# Patient Record
Sex: Female | Born: 1980
Health system: Southern US, Community
[De-identification: ages and names within clinical notes are randomized; demographics above are authoritative.]

---

## 2020-05-10 ENCOUNTER — Encounter (HOSPITAL_COMMUNITY): Payer: Self-pay | Admitting: Emergency Medicine

## 2020-05-10 ENCOUNTER — Other Ambulatory Visit: Payer: Self-pay

## 2020-05-10 ENCOUNTER — Ambulatory Visit (HOSPITAL_COMMUNITY)
Admission: EM | Admit: 2020-05-10 | Discharge: 2020-05-10 | Disposition: A | Discharge status: Home / Self Care | Payer: Self-pay

## 2020-05-10 ENCOUNTER — Ambulatory Visit (HOSPITAL_COMMUNITY): Admission: EM | Admit: 2020-05-10 | Discharge status: Absent without leave | Payer: Self-pay

## 2020-05-10 DIAGNOSIS — L0291 Cutaneous abscess, unspecified: Secondary | ICD-10-CM

## 2020-05-10 MED ORDER — NAPROXEN 500 MG PO TABS: 500.0000 mg | ORAL_TABLET | Freq: Two times a day (BID) | ORAL | 0 refills | Status: DC

## 2020-05-10 MED ORDER — LIDOCAINE-EPINEPHRINE-TETRACAINE (LET) TOPICAL GEL
TOPICAL | Status: AC
  Filled 2020-05-10: qty 3

## 2020-05-10 MED ORDER — DOXYCYCLINE HYCLATE 100 MG PO CAPS: 100.0000 mg | ORAL_CAPSULE | Freq: Two times a day (BID) | ORAL | 0 refills | Status: DC

## 2020-05-10 NOTE — ED Triage Notes (Signed)
Pt presents with abscess in right ear xs 3-4 days.

## 2020-05-10 NOTE — ED Provider Notes (Signed)
  [facility]   MRN: [id number] DOB: 07-21-1980  Subjective:   Ariel Martin is a 40 y.o. female presenting for 3 to 4-day history of acute onset worsening painful swollen lesion in her right ear canal.  She believes it is an abscess.  Denies any fever, nausea, vomiting, tinnitus, runny or stuffy nose, sore throat, cough.  Takes Paxil.  No Known Allergies  History reviewed. No pertinent past medical history.   History reviewed. No pertinent surgical history.  History reviewed. No pertinent family history.     ROS   Objective:   Vitals: BP (!) 158/94 (BP Location: Left Arm)   Pulse 94   Temp 98.7 F (37.1 C) (Oral)   Resp 17   LMP 05/01/2020   SpO2 96%   Physical Exam Constitutional:      General: She is not in acute distress.    Appearance: Normal appearance. She is well-developed. She is not ill-appearing.  HENT:     Head: Normocephalic and atraumatic.     Ears:     Comments: Fluctuant mass of about 1 cm at superior distalmost portion of the external ear canal.  Area is erythematous, exquisitely tender.    Nose: Nose normal.     Mouth/Throat:     Mouth: Mucous membranes are moist.     Pharynx: Oropharynx is clear.  Eyes:     General: No scleral icterus.    Extraocular Movements: Extraocular movements intact.     Pupils: Pupils are equal, round, and reactive to light.  Cardiovascular:     Rate and Rhythm: Normal rate.  Pulmonary:     Effort: Pulmonary effort is normal.  Skin:    General: Skin is warm and dry.  Neurological:     General: No focal deficit present.     Mental Status: She is alert and oriented to person, place, and time.  Psychiatric:        Mood and Affect: Mood normal.        Behavior: Behavior normal.    Procedure: Verbal consent obtained.  Local numbing with l.e.t.  Alcohol swab used over the area.  An 18-gauge needle was used to lance the area.  Discharge of about 2 cc of purulence.  Wound cleansed.  Pressure  dressing applied.   Assessment and Plan :   PDMP not reviewed this encounter.  1. Abscess     Successful incision and drainage.  Recommended started doxycycline, naproxen for pain control.  Counseled on wound care.  Follow-up with ENT if symptoms worsen. Counseled patient on potential for adverse effects with medications prescribed/recommended today, ER and return-to-clinic precautions discussed, patient verbalized understanding.     Wallis Bamberg, PA-C 05/11/20 [phone]

## 2021-01-30 ENCOUNTER — Ambulatory Visit
Admission: RE | Admit: 2021-01-30 | Discharge: 2021-01-30 | Disposition: A | Discharge status: Home / Self Care | Payer: Self-pay | Source: Ambulatory Visit | Attending: Family Medicine | Admitting: Family Medicine

## 2021-01-30 ENCOUNTER — Other Ambulatory Visit: Payer: Self-pay | Admitting: Family Medicine

## 2021-01-30 DIAGNOSIS — M7732 Calcaneal spur, left foot: Secondary | ICD-10-CM | POA: Diagnosis not present

## 2021-01-30 DIAGNOSIS — G4719 Other hypersomnia: Secondary | ICD-10-CM | POA: Diagnosis not present

## 2021-01-30 DIAGNOSIS — K219 Gastro-esophageal reflux disease without esophagitis: Secondary | ICD-10-CM | POA: Diagnosis not present

## 2021-01-30 DIAGNOSIS — M79672 Pain in left foot: Secondary | ICD-10-CM

## 2021-01-30 DIAGNOSIS — R0683 Snoring: Secondary | ICD-10-CM | POA: Diagnosis not present

## 2021-01-30 DIAGNOSIS — M19072 Primary osteoarthritis, left ankle and foot: Secondary | ICD-10-CM | POA: Diagnosis not present

## 2021-01-30 DIAGNOSIS — R030 Elevated blood-pressure reading, without diagnosis of hypertension: Secondary | ICD-10-CM | POA: Diagnosis not present

## 2021-01-30 DIAGNOSIS — Z720 Tobacco use: Secondary | ICD-10-CM | POA: Diagnosis not present

## 2021-01-30 DIAGNOSIS — F418 Other specified anxiety disorders: Secondary | ICD-10-CM | POA: Diagnosis not present

## 2021-02-05 ENCOUNTER — Other Ambulatory Visit (HOSPITAL_BASED_OUTPATIENT_CLINIC_OR_DEPARTMENT_OTHER): Payer: Self-pay

## 2021-02-05 DIAGNOSIS — R0681 Apnea, not elsewhere classified: Secondary | ICD-10-CM

## 2021-02-05 DIAGNOSIS — R0683 Snoring: Secondary | ICD-10-CM

## 2021-02-05 DIAGNOSIS — G4710 Hypersomnia, unspecified: Secondary | ICD-10-CM

## 2021-02-22 ENCOUNTER — Other Ambulatory Visit (HOSPITAL_COMMUNITY): Payer: Self-pay

## 2021-02-22 MED ORDER — PANTOPRAZOLE SODIUM 20 MG PO TBEC
20.0000 mg | DELAYED_RELEASE_TABLET | Freq: Every day | ORAL | 0 refills | Status: DC
Start: 2021-01-30 — End: 2023-10-28
  Filled 2021-02-22: qty 90, 90d supply, fill #0

## 2021-02-22 MED ORDER — ESCITALOPRAM OXALATE 10 MG PO TABS
10.0000 mg | ORAL_TABLET | Freq: Every day | ORAL | 0 refills | Status: DC
  Filled 2021-02-22: qty 60, 60d supply, fill #0

## 2021-02-22 MED ORDER — ESCITALOPRAM OXALATE 10 MG PO TABS: 10.0000 mg | ORAL_TABLET | Freq: Every day | ORAL | 0 refills | Status: DC

## 2021-02-23 ENCOUNTER — Other Ambulatory Visit (HOSPITAL_COMMUNITY): Payer: Self-pay

## 2021-03-02 DIAGNOSIS — R030 Elevated blood-pressure reading, without diagnosis of hypertension: Secondary | ICD-10-CM | POA: Diagnosis not present

## 2021-03-02 DIAGNOSIS — Z0000 Encounter for general adult medical examination without abnormal findings: Secondary | ICD-10-CM | POA: Diagnosis not present

## 2021-03-02 DIAGNOSIS — Z13220 Encounter for screening for lipoid disorders: Secondary | ICD-10-CM | POA: Diagnosis not present

## 2021-03-02 DIAGNOSIS — Z23 Encounter for immunization: Secondary | ICD-10-CM | POA: Diagnosis not present

## 2021-03-02 DIAGNOSIS — F418 Other specified anxiety disorders: Secondary | ICD-10-CM | POA: Diagnosis not present

## 2021-03-02 DIAGNOSIS — G4719 Other hypersomnia: Secondary | ICD-10-CM | POA: Diagnosis not present

## 2021-04-01 ENCOUNTER — Encounter: Payer: 59 | Admitting: Internal Medicine

## 2021-04-25 ENCOUNTER — Other Ambulatory Visit (HOSPITAL_COMMUNITY): Payer: Self-pay

## 2021-04-25 MED ORDER — ESCITALOPRAM OXALATE 10 MG PO TABS
20.0000 mg | ORAL_TABLET | Freq: Every day | ORAL | 1 refills | Status: DC
  Filled 2021-04-25: qty 180, 90d supply, fill #0

## 2021-04-30 ENCOUNTER — Other Ambulatory Visit (HOSPITAL_COMMUNITY): Payer: Self-pay

## 2021-06-11 ENCOUNTER — Other Ambulatory Visit (HOSPITAL_COMMUNITY): Payer: Self-pay

## 2021-06-11 MED ORDER — PANTOPRAZOLE SODIUM 20 MG PO TBEC
DELAYED_RELEASE_TABLET | ORAL | 1 refills | Status: DC
Start: 2021-06-11 — End: 2022-09-02
  Filled 2021-06-11: qty 90, 90d supply, fill #0
  Filled 2021-09-25: qty 90, 90d supply, fill #1
  Filled 2022-03-12: qty 20, 20d supply, fill #2

## 2021-06-11 MED ORDER — DICLOFENAC SODIUM 1 % EX GEL
CUTANEOUS | 1 refills | Status: DC
Start: 2021-06-11 — End: 2022-08-07
  Filled 2021-06-11: qty 200, 30d supply, fill #0
  Filled 2022-03-12: qty 100, 15d supply, fill #1
  Filled 2022-03-12: qty 100, 25d supply, fill #1

## 2021-08-06 ENCOUNTER — Other Ambulatory Visit (HOSPITAL_COMMUNITY): Payer: Self-pay

## 2021-08-06 MED ORDER — ESCITALOPRAM OXALATE 10 MG PO TABS
20.0000 mg | ORAL_TABLET | Freq: Every day | ORAL | 1 refills | Status: DC
  Filled 2021-08-06: qty 180, 90d supply, fill #0
  Filled 2021-10-30: qty 180, 90d supply, fill #1

## 2021-08-06 MED ORDER — PANTOPRAZOLE SODIUM 20 MG PO TBEC
20.0000 mg | DELAYED_RELEASE_TABLET | Freq: Every day | ORAL | 1 refills | Status: DC
  Filled 2021-08-06 – 2021-10-30 (×2): qty 100, 100d supply, fill #0
  Filled 2021-12-20: qty 90, 90d supply, fill #0
  Filled 2022-04-11: qty 90, 90d supply, fill #1
  Filled 2022-07-04: qty 90, 90d supply, fill #2

## 2021-08-09 ENCOUNTER — Other Ambulatory Visit (HOSPITAL_COMMUNITY): Payer: Self-pay

## 2021-09-05 ENCOUNTER — Ambulatory Visit: Payer: 59 | Attending: Internal Medicine | Admitting: Internal Medicine

## 2021-09-05 DIAGNOSIS — G4710 Hypersomnia, unspecified: Secondary | ICD-10-CM | POA: Insufficient documentation

## 2021-09-05 DIAGNOSIS — G4733 Obstructive sleep apnea (adult) (pediatric): Secondary | ICD-10-CM | POA: Insufficient documentation

## 2021-09-05 DIAGNOSIS — R0683 Snoring: Secondary | ICD-10-CM | POA: Insufficient documentation

## 2021-09-05 DIAGNOSIS — G4736 Sleep related hypoventilation in conditions classified elsewhere: Secondary | ICD-10-CM | POA: Diagnosis not present

## 2021-09-05 DIAGNOSIS — R0681 Apnea, not elsewhere classified: Secondary | ICD-10-CM

## 2021-09-15 DIAGNOSIS — G4733 Obstructive sleep apnea (adult) (pediatric): Secondary | ICD-10-CM | POA: Diagnosis not present

## 2021-09-19 NOTE — Procedures (Signed)
    NAME: Ariel Martin DATE OF BIRTH:  1980-11-13 MEDICAL RECORD NUMBER [id number]  LOCATION: [facility]  PHYSICIAN: Deretha Emory  DATE OF STUDY: 09/05/2021  SLEEP STUDY TYPE: Out of Center Sleep Test                REFERRING PHYSICIAN: Irven Coe, MD  EPWORTH SLEEPINESS SCORE:  N/A HEIGHT:    WEIGHT:      There is no height or weight on file to calculate BMI.  NECK SIZE:   in.  CLINICAL INFORMATION The patient was referred to the sleep center for evaluation of excessive daytime sleepiness, awakening gasping for breath, snoring.  MEDICATIONS Patient self administered medications: none reported. No sleep medicine reported by patient  SLEEP STUDY TECHNIQUE A multi-channel overnight portable sleep study was performed. The channels recorded were: nasal and oral airflow, thoracic and abdominal respiratory movement, and oxygen saturation with a pulse oximetry. Snoring and body position were also monitored.  TECHNICIAN COMMENTS Comments added by Technician: N/A Comments added by Scorer: N/A  RECORDING SUMMARY The study was initiated at 10:00:02 PM and terminated at 5:59:56 AM. The total recorded time was 479.9 minutes. Time in bed was 479.9 minutes.  RESPIRATORY PARAMETERS The overall AHI was 79.5 per hour, with a central apnea index of 0 per hour. The patient was supine for 30% of the study. The oxygen nadir was 77% during sleep.  CARDIAC DATA Mean heart rate during sleep was 72.7 bpm.  IMPRESSIONS - Severe Obstructive Sleep Apnea (OSA) - Severe Oxygen Desaturation  DIAGNOSIS - Obstructive Sleep Apnea (G47.33) - Nocturnal Hypoxemia (G47.36)  RECOMMENDATIONS - Therapeutic CPAP titration to determine optimal pressure required to alleviate sleep disordered breathing or a trial of Auto-CPAP 4-16 cm H2O is recommended  Deretha Emory Sleep specialist, American Board of Internal Medicine  ELECTRONICALLY SIGNED ON:  09/19/2021, 9:04 PM [facility] PH: [phone]   FX: [phone] ACCREDITED BY THE AMERICAN ACADEMY OF SLEEP MEDICINE

## 2021-09-20 DIAGNOSIS — G4733 Obstructive sleep apnea (adult) (pediatric): Secondary | ICD-10-CM | POA: Diagnosis not present

## 2021-09-25 ENCOUNTER — Other Ambulatory Visit (HOSPITAL_COMMUNITY): Payer: Self-pay

## 2021-10-01 DIAGNOSIS — G4733 Obstructive sleep apnea (adult) (pediatric): Secondary | ICD-10-CM | POA: Diagnosis not present

## 2021-10-31 ENCOUNTER — Other Ambulatory Visit (HOSPITAL_COMMUNITY): Payer: Self-pay

## 2021-10-31 DIAGNOSIS — G4733 Obstructive sleep apnea (adult) (pediatric): Secondary | ICD-10-CM | POA: Diagnosis not present

## 2021-12-01 DIAGNOSIS — G4733 Obstructive sleep apnea (adult) (pediatric): Secondary | ICD-10-CM | POA: Diagnosis not present

## 2021-12-06 DIAGNOSIS — G4733 Obstructive sleep apnea (adult) (pediatric): Secondary | ICD-10-CM | POA: Diagnosis not present

## 2021-12-06 DIAGNOSIS — R030 Elevated blood-pressure reading, without diagnosis of hypertension: Secondary | ICD-10-CM | POA: Diagnosis not present

## 2021-12-20 ENCOUNTER — Other Ambulatory Visit (HOSPITAL_COMMUNITY): Payer: Self-pay

## 2022-01-01 DIAGNOSIS — G4733 Obstructive sleep apnea (adult) (pediatric): Secondary | ICD-10-CM | POA: Diagnosis not present

## 2022-01-12 IMAGING — DX DG FOOT 2V*L*
2 series · 2 of 2 positions shown · non-contrast
Comparison: None.

CLINICAL DATA: Left-sided foot pain

EXAM:
LEFT FOOT - 2 VIEW

[dg foot 2 views left (1 of 2)]
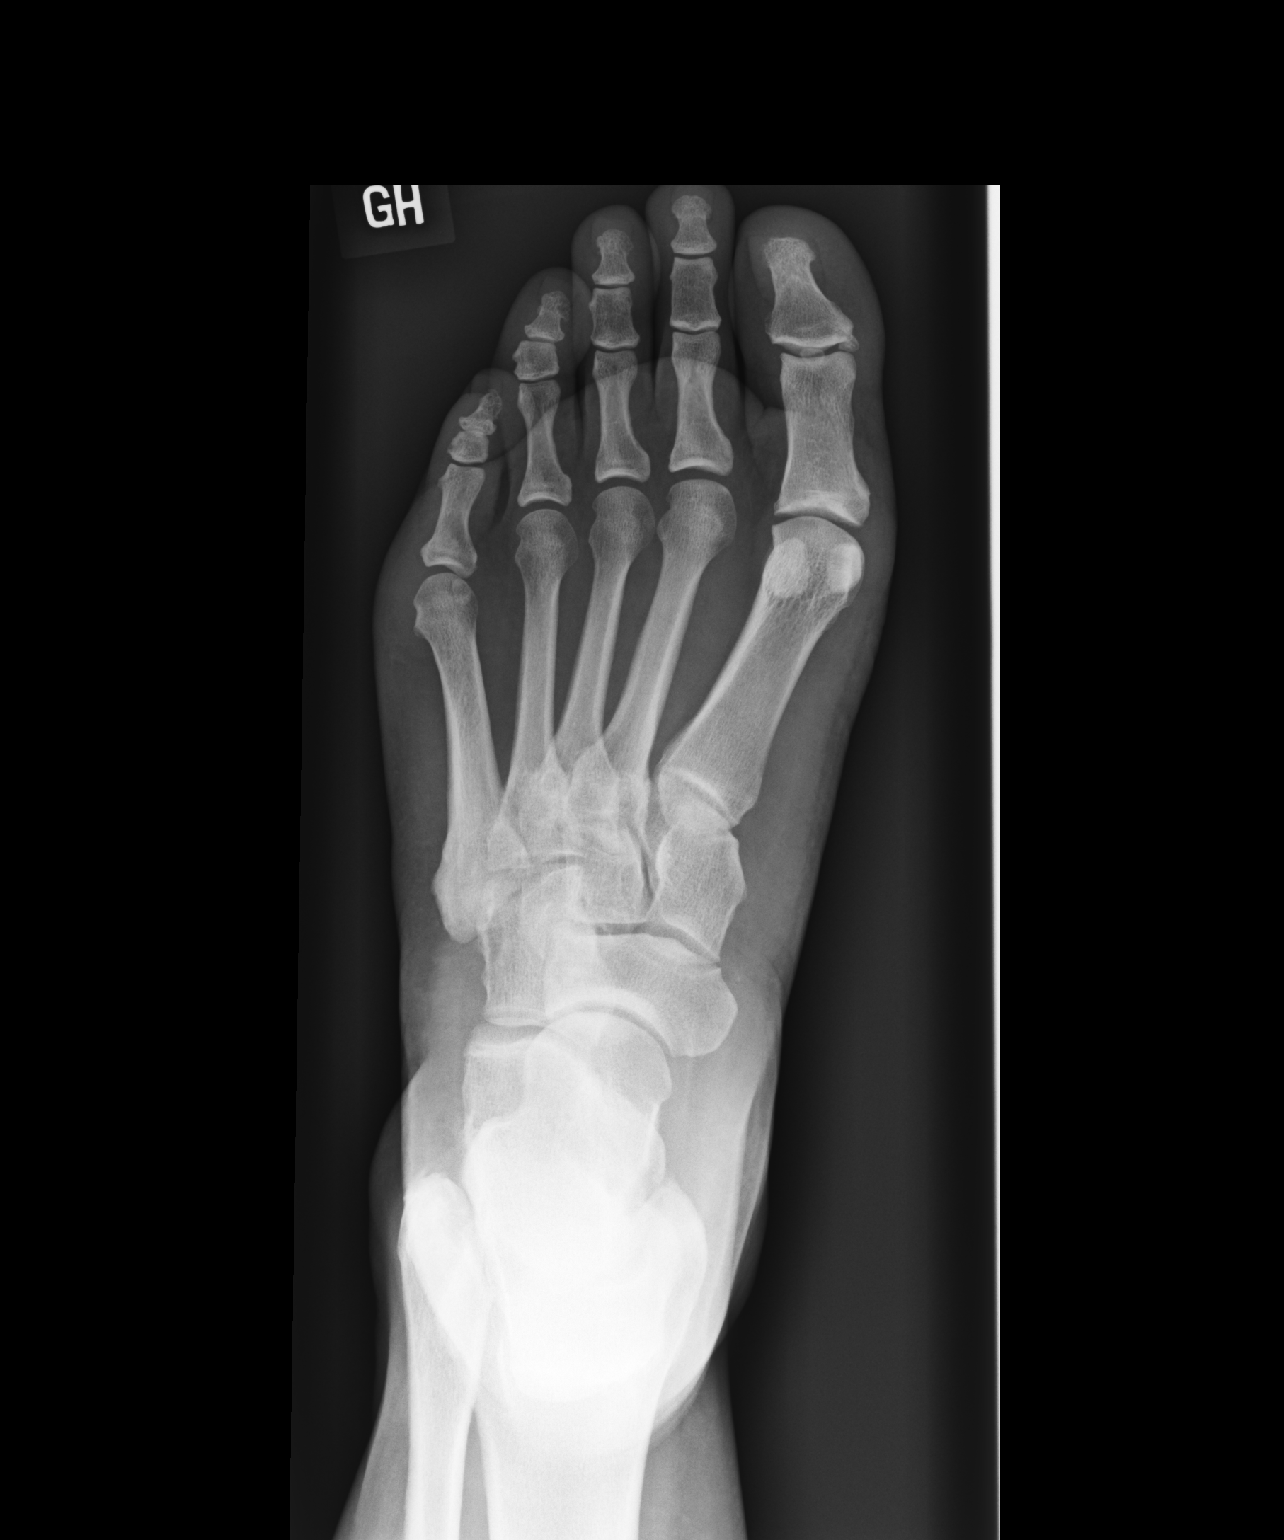

[dg foot 2 views left (2 of 2)]
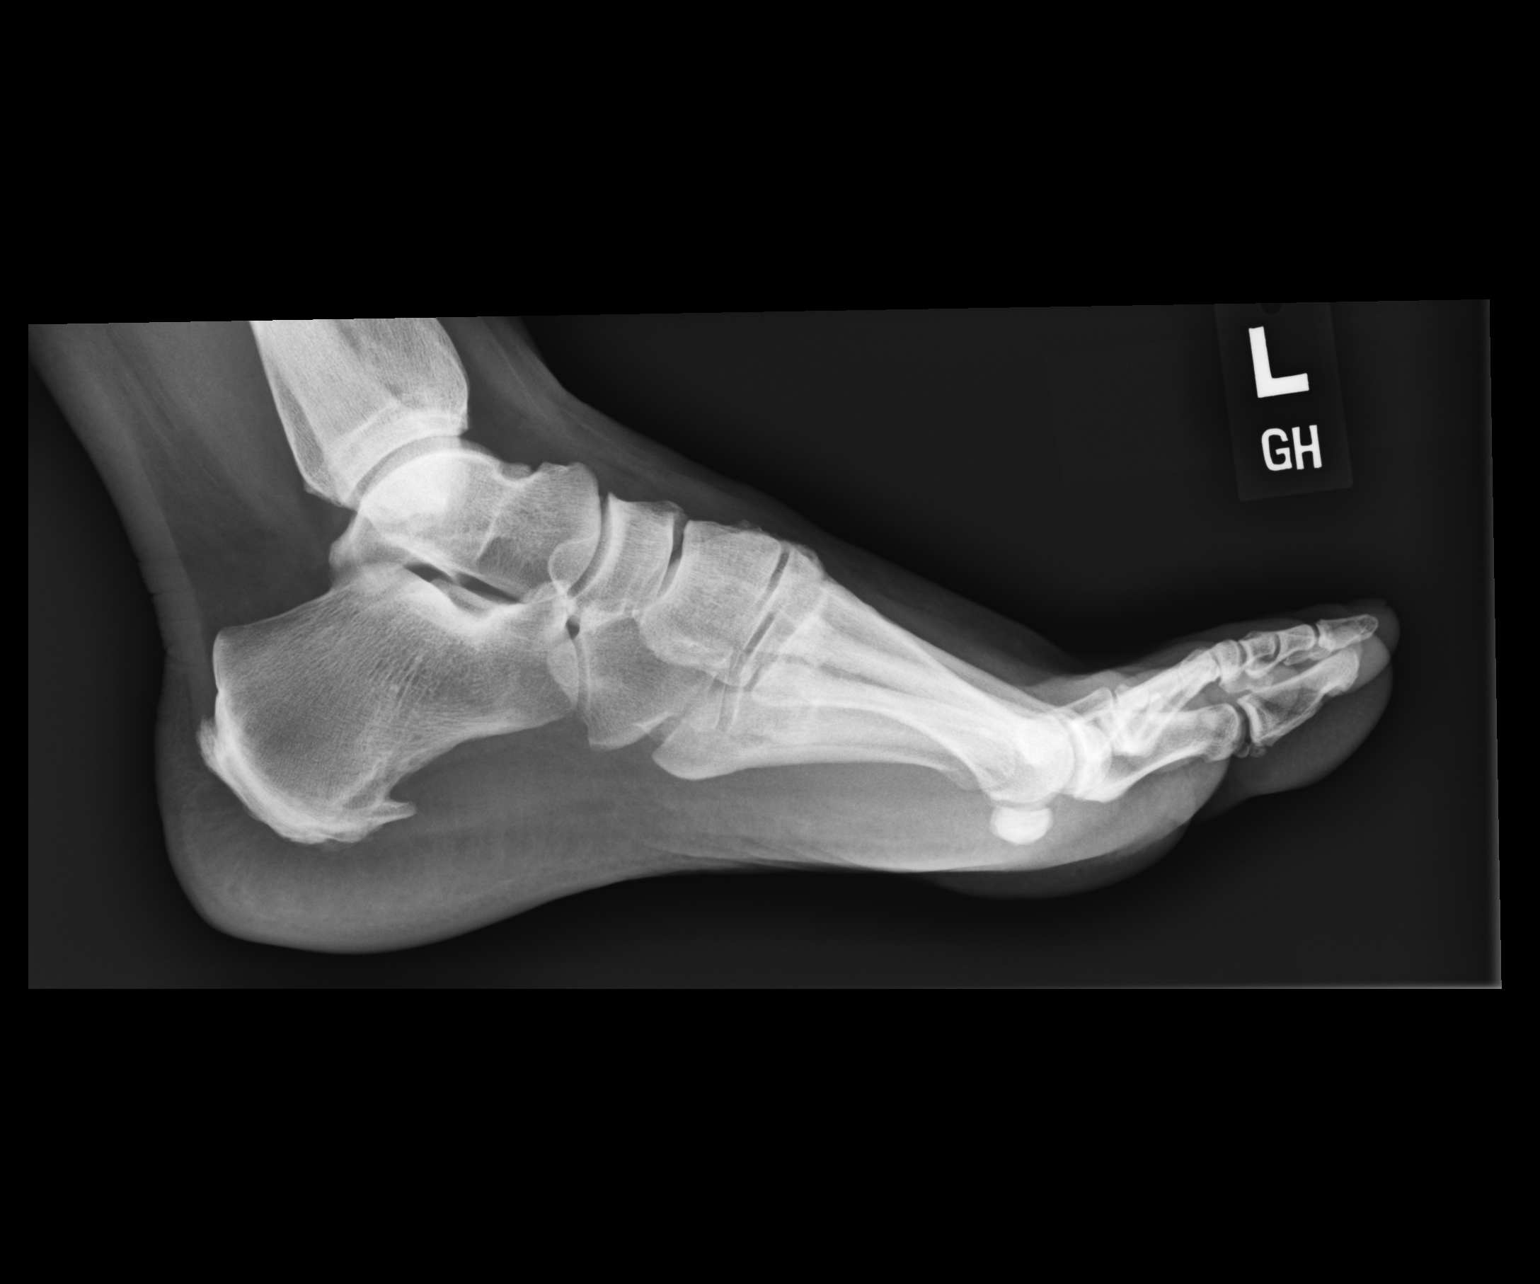

[2 of 2 positions shown; findings below may reference images not displayed]

FINDINGS: No fracture or malalignment. Mild degenerative changes at the first
MTP joint. Moderate plantar calcaneal spur.
IMPRESSION: Mild degenerative change at the first MTP joint.

## 2022-01-29 ENCOUNTER — Other Ambulatory Visit (HOSPITAL_COMMUNITY): Payer: Self-pay

## 2022-01-29 MED ORDER — ESCITALOPRAM OXALATE 10 MG PO TABS
20.0000 mg | ORAL_TABLET | Freq: Every day | ORAL | 1 refills | Status: DC
Start: 2022-01-29 — End: 2022-08-07
  Filled 2022-01-29: qty 180, 90d supply, fill #0
  Filled 2022-04-28: qty 180, 90d supply, fill #1

## 2022-03-12 ENCOUNTER — Other Ambulatory Visit (HOSPITAL_COMMUNITY): Payer: Self-pay

## 2022-03-13 ENCOUNTER — Other Ambulatory Visit (HOSPITAL_COMMUNITY): Payer: Self-pay

## 2022-04-11 ENCOUNTER — Other Ambulatory Visit (HOSPITAL_COMMUNITY): Payer: Self-pay

## 2022-04-28 ENCOUNTER — Other Ambulatory Visit (HOSPITAL_COMMUNITY): Payer: Self-pay

## 2022-05-29 DIAGNOSIS — G4733 Obstructive sleep apnea (adult) (pediatric): Secondary | ICD-10-CM | POA: Diagnosis not present

## 2022-05-29 DIAGNOSIS — R7303 Prediabetes: Secondary | ICD-10-CM | POA: Diagnosis not present

## 2022-05-29 DIAGNOSIS — R030 Elevated blood-pressure reading, without diagnosis of hypertension: Secondary | ICD-10-CM | POA: Diagnosis not present

## 2022-05-29 DIAGNOSIS — K219 Gastro-esophageal reflux disease without esophagitis: Secondary | ICD-10-CM | POA: Diagnosis not present

## 2022-05-29 DIAGNOSIS — F418 Other specified anxiety disorders: Secondary | ICD-10-CM | POA: Diagnosis not present

## 2022-05-29 DIAGNOSIS — Z1159 Encounter for screening for other viral diseases: Secondary | ICD-10-CM | POA: Diagnosis not present

## 2022-05-29 DIAGNOSIS — M25552 Pain in left hip: Secondary | ICD-10-CM | POA: Diagnosis not present

## 2022-05-29 DIAGNOSIS — Z0189 Encounter for other specified special examinations: Secondary | ICD-10-CM | POA: Diagnosis not present

## 2022-06-12 ENCOUNTER — Other Ambulatory Visit: Payer: Self-pay | Admitting: Nurse Practitioner

## 2022-06-12 ENCOUNTER — Other Ambulatory Visit (HOSPITAL_COMMUNITY)
Admission: RE | Admit: 2022-06-12 | Discharge: 2022-06-12 | Disposition: A | Discharge status: Home / Self Care | Payer: Commercial Managed Care - PPO | Source: Ambulatory Visit | Attending: Nurse Practitioner | Admitting: Nurse Practitioner

## 2022-06-12 DIAGNOSIS — R030 Elevated blood-pressure reading, without diagnosis of hypertension: Secondary | ICD-10-CM | POA: Diagnosis not present

## 2022-06-12 DIAGNOSIS — N921 Excessive and frequent menstruation with irregular cycle: Secondary | ICD-10-CM | POA: Diagnosis not present

## 2022-06-12 DIAGNOSIS — Z124 Encounter for screening for malignant neoplasm of cervix: Secondary | ICD-10-CM | POA: Insufficient documentation

## 2022-06-12 DIAGNOSIS — N898 Other specified noninflammatory disorders of vagina: Secondary | ICD-10-CM | POA: Diagnosis not present

## 2022-06-12 DIAGNOSIS — Z1231 Encounter for screening mammogram for malignant neoplasm of breast: Secondary | ICD-10-CM

## 2022-06-12 DIAGNOSIS — N946 Dysmenorrhea, unspecified: Secondary | ICD-10-CM | POA: Diagnosis not present

## 2022-06-12 DIAGNOSIS — N923 Ovulation bleeding: Secondary | ICD-10-CM | POA: Diagnosis not present

## 2022-06-12 DIAGNOSIS — Z113 Encounter for screening for infections with a predominantly sexual mode of transmission: Secondary | ICD-10-CM | POA: Diagnosis not present

## 2022-06-12 DIAGNOSIS — R7401 Elevation of levels of liver transaminase levels: Secondary | ICD-10-CM | POA: Diagnosis not present

## 2022-06-12 DIAGNOSIS — Z01419 Encounter for gynecological examination (general) (routine) without abnormal findings: Secondary | ICD-10-CM | POA: Diagnosis not present

## 2022-06-14 LAB — CYTOLOGY - PAP
Comment: NEGATIVE
Diagnosis: NEGATIVE
High risk HPV: NEGATIVE

## 2022-06-19 ENCOUNTER — Other Ambulatory Visit (HOSPITAL_COMMUNITY): Payer: Self-pay

## 2022-06-19 MED ORDER — TRAZODONE HCL 100 MG PO TABS
100.0000 mg | ORAL_TABLET | Freq: Every evening | ORAL | 1 refills | Status: DC | PRN
Start: 2022-06-19 — End: 2022-12-31
  Filled 2022-06-19: qty 90, 90d supply, fill #0
  Filled 2022-09-30: qty 90, 90d supply, fill #1

## 2022-06-20 ENCOUNTER — Encounter (HOSPITAL_COMMUNITY): Payer: Self-pay

## 2022-06-20 ENCOUNTER — Other Ambulatory Visit: Payer: Self-pay

## 2022-06-20 ENCOUNTER — Other Ambulatory Visit (HOSPITAL_COMMUNITY): Payer: Self-pay

## 2022-06-24 ENCOUNTER — Other Ambulatory Visit (HOSPITAL_COMMUNITY): Payer: Self-pay

## 2022-07-02 ENCOUNTER — Ambulatory Visit
Admission: RE | Admit: 2022-07-02 | Discharge: 2022-07-02 | Disposition: A | Discharge status: Home / Self Care | Payer: Commercial Managed Care - PPO | Source: Ambulatory Visit | Attending: Family Medicine | Admitting: Family Medicine

## 2022-07-02 ENCOUNTER — Other Ambulatory Visit: Payer: Self-pay | Admitting: Family Medicine

## 2022-07-02 DIAGNOSIS — M25552 Pain in left hip: Secondary | ICD-10-CM

## 2022-07-02 DIAGNOSIS — M19072 Primary osteoarthritis, left ankle and foot: Secondary | ICD-10-CM | POA: Diagnosis not present

## 2022-07-03 DIAGNOSIS — Z3202 Encounter for pregnancy test, result negative: Secondary | ICD-10-CM | POA: Diagnosis not present

## 2022-07-03 DIAGNOSIS — N921 Excessive and frequent menstruation with irregular cycle: Secondary | ICD-10-CM | POA: Diagnosis not present

## 2022-07-04 ENCOUNTER — Other Ambulatory Visit (HOSPITAL_COMMUNITY): Payer: Self-pay

## 2022-07-04 ENCOUNTER — Other Ambulatory Visit: Payer: Self-pay

## 2022-07-04 MED ORDER — MELOXICAM 7.5 MG PO TABS
7.5000 mg | ORAL_TABLET | Freq: Every day | ORAL | 6 refills | Status: DC
  Filled 2022-07-04: qty 60, 30d supply, fill #0
  Filled 2022-08-07: qty 60, 30d supply, fill #1
  Filled 2022-09-01: qty 60, 30d supply, fill #2
  Filled 2022-09-30: qty 60, 30d supply, fill #3
  Filled 2022-11-12: qty 60, 30d supply, fill #4
  Filled 2022-12-29 – 2022-12-31 (×2): qty 60, 30d supply, fill #5
  Filled 2023-02-05: qty 60, 30d supply, fill #6

## 2022-07-05 ENCOUNTER — Other Ambulatory Visit: Payer: Self-pay

## 2022-07-05 ENCOUNTER — Other Ambulatory Visit (HOSPITAL_COMMUNITY): Payer: Self-pay

## 2022-07-05 MED ORDER — PANTOPRAZOLE SODIUM 20 MG PO TBEC
20.0000 mg | DELAYED_RELEASE_TABLET | Freq: Every day | ORAL | 0 refills | Status: DC
Start: 2022-07-04 — End: 2023-10-28
  Filled 2022-07-05: qty 90, 90d supply, fill #0

## 2022-07-22 DIAGNOSIS — N921 Excessive and frequent menstruation with irregular cycle: Secondary | ICD-10-CM | POA: Diagnosis not present

## 2022-07-25 ENCOUNTER — Ambulatory Visit
Admission: RE | Admit: 2022-07-25 | Discharge: 2022-07-25 | Disposition: A | Discharge status: Home / Self Care | Payer: Commercial Managed Care - PPO | Source: Ambulatory Visit | Attending: Nurse Practitioner | Admitting: Nurse Practitioner

## 2022-07-25 DIAGNOSIS — Z1231 Encounter for screening mammogram for malignant neoplasm of breast: Secondary | ICD-10-CM | POA: Diagnosis not present

## 2022-07-31 DIAGNOSIS — Z30430 Encounter for insertion of intrauterine contraceptive device: Secondary | ICD-10-CM | POA: Diagnosis not present

## 2022-07-31 DIAGNOSIS — Z3202 Encounter for pregnancy test, result negative: Secondary | ICD-10-CM | POA: Diagnosis not present

## 2022-08-07 ENCOUNTER — Other Ambulatory Visit (HOSPITAL_COMMUNITY): Payer: Self-pay

## 2022-08-07 ENCOUNTER — Other Ambulatory Visit: Payer: Self-pay

## 2022-08-07 MED ORDER — ESCITALOPRAM OXALATE 10 MG PO TABS
20.0000 mg | ORAL_TABLET | Freq: Every day | ORAL | 1 refills | Status: DC
  Filled 2022-08-07: qty 180, 90d supply, fill #0
  Filled 2022-11-12: qty 180, 90d supply, fill #1

## 2022-08-07 MED ORDER — DICLOFENAC SODIUM 1 % EX GEL
CUTANEOUS | 1 refills | Status: DC
Start: 2022-08-07 — End: 2023-10-28
  Filled 2022-08-07: qty 100, 7d supply, fill #0
  Filled 2022-09-01: qty 100, 7d supply, fill #1
  Filled 2022-09-30: qty 100, 7d supply, fill #2

## 2022-09-01 ENCOUNTER — Other Ambulatory Visit (HOSPITAL_COMMUNITY): Payer: Self-pay

## 2022-09-02 ENCOUNTER — Other Ambulatory Visit: Payer: Self-pay

## 2022-09-02 ENCOUNTER — Other Ambulatory Visit (HOSPITAL_COMMUNITY): Payer: Self-pay

## 2022-09-02 MED ORDER — PANTOPRAZOLE SODIUM 20 MG PO TBEC
20.0000 mg | DELAYED_RELEASE_TABLET | Freq: Every day | ORAL | 1 refills | Status: DC
Start: 2022-09-02 — End: 2023-04-28
  Filled 2022-10-08: qty 90, 90d supply, fill #0
  Filled 2022-12-29 – 2022-12-31 (×2): qty 90, 90d supply, fill #1
  Filled 2023-04-26: qty 90, 90d supply, fill #2

## 2022-09-09 DIAGNOSIS — Z30431 Encounter for routine checking of intrauterine contraceptive device: Secondary | ICD-10-CM | POA: Diagnosis not present

## 2022-10-01 ENCOUNTER — Other Ambulatory Visit: Payer: Self-pay

## 2022-10-08 ENCOUNTER — Other Ambulatory Visit: Payer: Self-pay

## 2022-10-08 ENCOUNTER — Other Ambulatory Visit (HOSPITAL_COMMUNITY): Payer: Self-pay

## 2022-11-12 ENCOUNTER — Other Ambulatory Visit: Payer: Self-pay

## 2022-12-29 ENCOUNTER — Other Ambulatory Visit (HOSPITAL_COMMUNITY): Payer: Self-pay

## 2022-12-31 ENCOUNTER — Other Ambulatory Visit: Payer: Self-pay

## 2022-12-31 ENCOUNTER — Other Ambulatory Visit (HOSPITAL_COMMUNITY): Payer: Self-pay

## 2022-12-31 MED ORDER — ESCITALOPRAM OXALATE 10 MG PO TABS
20.0000 mg | ORAL_TABLET | Freq: Every day | ORAL | 1 refills | Status: DC
Start: 2022-12-31 — End: 2023-10-28
  Filled 2023-02-05: qty 180, 90d supply, fill #0
  Filled 2023-05-24: qty 180, 90d supply, fill #1

## 2022-12-31 MED ORDER — TRAZODONE HCL 100 MG PO TABS
100.0000 mg | ORAL_TABLET | Freq: Every evening | ORAL | 1 refills | Status: DC | PRN
Start: 2022-12-31 — End: 2023-07-01
  Filled 2022-12-31: qty 90, 90d supply, fill #0
  Filled 2023-04-02: qty 90, 90d supply, fill #1

## 2023-01-01 ENCOUNTER — Other Ambulatory Visit (HOSPITAL_COMMUNITY): Payer: Self-pay

## 2023-01-02 ENCOUNTER — Other Ambulatory Visit (HOSPITAL_COMMUNITY): Payer: Self-pay

## 2023-02-05 ENCOUNTER — Other Ambulatory Visit (HOSPITAL_COMMUNITY): Payer: Self-pay

## 2023-02-05 ENCOUNTER — Other Ambulatory Visit: Payer: Self-pay

## 2023-04-01 DIAGNOSIS — M545 Low back pain, unspecified: Secondary | ICD-10-CM | POA: Diagnosis not present

## 2023-04-02 ENCOUNTER — Other Ambulatory Visit: Payer: Self-pay

## 2023-04-02 ENCOUNTER — Other Ambulatory Visit (HOSPITAL_COMMUNITY): Payer: Self-pay

## 2023-04-02 MED ORDER — MELOXICAM 7.5 MG PO TABS
7.5000 mg | ORAL_TABLET | Freq: Every day | ORAL | 6 refills | Status: DC
  Filled 2023-04-02: qty 60, 30d supply, fill #0
  Filled 2023-05-24: qty 60, 30d supply, fill #1
  Filled 2023-07-22: qty 60, 30d supply, fill #2

## 2023-04-26 ENCOUNTER — Other Ambulatory Visit (HOSPITAL_COMMUNITY): Payer: Self-pay

## 2023-04-28 ENCOUNTER — Other Ambulatory Visit (HOSPITAL_COMMUNITY): Payer: Self-pay

## 2023-04-28 ENCOUNTER — Other Ambulatory Visit: Payer: Self-pay

## 2023-04-28 MED ORDER — PANTOPRAZOLE SODIUM 20 MG PO TBEC
20.0000 mg | DELAYED_RELEASE_TABLET | Freq: Every day | ORAL | 1 refills | Status: DC
  Filled 2023-04-28: qty 90, 90d supply, fill #0
  Filled 2023-07-22: qty 90, 90d supply, fill #1

## 2023-04-29 DIAGNOSIS — R030 Elevated blood-pressure reading, without diagnosis of hypertension: Secondary | ICD-10-CM | POA: Diagnosis not present

## 2023-04-29 DIAGNOSIS — M5431 Sciatica, right side: Secondary | ICD-10-CM | POA: Diagnosis not present

## 2023-05-25 ENCOUNTER — Other Ambulatory Visit (HOSPITAL_COMMUNITY): Payer: Self-pay

## 2023-06-22 ENCOUNTER — Telehealth: Payer: Commercial Managed Care - PPO | Admitting: Family

## 2023-06-22 DIAGNOSIS — L03019 Cellulitis of unspecified finger: Secondary | ICD-10-CM

## 2023-06-22 MED ORDER — SULFAMETHOXAZOLE-TRIMETHOPRIM 800-160 MG PO TABS: 1.0000 | ORAL_TABLET | Freq: Two times a day (BID) | ORAL | 0 refills | Status: DC

## 2023-06-22 NOTE — Progress Notes (Signed)
 E-Visit for Cellulitis  We are sorry that you are not feeling well. Here is how we plan to help!  Based on what you shared with me it looks like you have cellulitis/paronychia .  Cellulitis looks like areas of skin redness, swelling, and warmth; it develops as a result of bacteria entering under the skin. Little red spots and/or bleeding can be seen in skin, and tiny surface sacs containing fluid can occur. Fever can be present. Cellulitis is almost always on one side of a body, and the lower limbs are the most common site of involvement.   I have prescribed:  Bactrim DS 1 tablet by mouth twice a day for 7 days  HOME CARE:  Take your medications as ordered and take all of them, even if the skin irritation appears to be healing.   GET HELP RIGHT AWAY IF:  Symptoms that don't begin to go away within 48 hours. Severe redness persists or worsens If the area turns color, spreads or swells. If it blisters and opens, develops yellow-brown crust or bleeds. You develop a fever or chills. If the pain increases or becomes unbearable.  Are unable to keep fluids and food down.  MAKE SURE YOU   Understand these instructions. Will watch your condition. Will get help right away if you are not doing well or get worse.  Thank you for choosing an e-visit.  Your e-visit answers were reviewed by a board certified advanced clinical practitioner to complete your personal care plan. Depending upon the condition, your plan could have included both over the counter or prescription medications.  Please review your pharmacy choice. Make sure the pharmacy is open so you can pick up prescription now. If there is a problem, you may contact your provider through [employer] and have the prescription routed to another pharmacy.  Your safety is important to [place]. If you have drug allergies check your prescription carefully.   For the next 24 hours you can use MyChart to ask questions about today's visit, request  a non-urgent call back, or ask for a work or school excuse. You will get an email in the next two days asking about your experience. I hope that your e-visit has been valuable and will speed your recovery.  Approximately 5 minutes was spent documenting and reviewing patient's chart.

## 2023-06-28 ENCOUNTER — Other Ambulatory Visit (HOSPITAL_COMMUNITY): Payer: Self-pay

## 2023-07-01 ENCOUNTER — Other Ambulatory Visit (HOSPITAL_COMMUNITY): Payer: Self-pay

## 2023-07-01 ENCOUNTER — Other Ambulatory Visit: Payer: Self-pay

## 2023-07-01 MED ORDER — TRAZODONE HCL 100 MG PO TABS
100.0000 mg | ORAL_TABLET | Freq: Every evening | ORAL | 0 refills | Status: DC | PRN
Start: 2023-07-01 — End: 2023-10-28
  Filled 2023-07-01: qty 30, 30d supply, fill #0

## 2023-07-22 ENCOUNTER — Other Ambulatory Visit: Payer: Self-pay

## 2023-08-03 ENCOUNTER — Other Ambulatory Visit (HOSPITAL_COMMUNITY): Payer: Self-pay

## 2023-08-06 ENCOUNTER — Other Ambulatory Visit (HOSPITAL_COMMUNITY): Payer: Self-pay

## 2023-08-29 ENCOUNTER — Other Ambulatory Visit (HOSPITAL_COMMUNITY): Payer: Self-pay

## 2023-09-08 ENCOUNTER — Other Ambulatory Visit (HOSPITAL_COMMUNITY): Payer: Self-pay

## 2023-10-28 ENCOUNTER — Ambulatory Visit: Admitting: Physician Assistant

## 2023-10-28 ENCOUNTER — Encounter: Payer: Self-pay | Admitting: Physician Assistant

## 2023-10-28 VITALS — BP 142/88 | HR 107 | Temp 98.1°F | Ht 65.25 in | Wt 276.4 lb

## 2023-10-28 DIAGNOSIS — R7303 Prediabetes: Secondary | ICD-10-CM

## 2023-10-28 DIAGNOSIS — R030 Elevated blood-pressure reading, without diagnosis of hypertension: Secondary | ICD-10-CM | POA: Diagnosis not present

## 2023-10-28 DIAGNOSIS — M722 Plantar fascial fibromatosis: Secondary | ICD-10-CM

## 2023-10-28 DIAGNOSIS — Z13220 Encounter for screening for lipoid disorders: Secondary | ICD-10-CM

## 2023-10-28 DIAGNOSIS — K219 Gastro-esophageal reflux disease without esophagitis: Secondary | ICD-10-CM | POA: Diagnosis not present

## 2023-10-28 DIAGNOSIS — M160 Bilateral primary osteoarthritis of hip: Secondary | ICD-10-CM

## 2023-10-28 DIAGNOSIS — I10 Essential (primary) hypertension: Secondary | ICD-10-CM | POA: Insufficient documentation

## 2023-10-28 DIAGNOSIS — G4700 Insomnia, unspecified: Secondary | ICD-10-CM

## 2023-10-28 DIAGNOSIS — G4733 Obstructive sleep apnea (adult) (pediatric): Secondary | ICD-10-CM | POA: Insufficient documentation

## 2023-10-28 DIAGNOSIS — Z7689 Persons encountering health services in other specified circumstances: Secondary | ICD-10-CM

## 2023-10-28 DIAGNOSIS — F339 Major depressive disorder, recurrent, unspecified: Secondary | ICD-10-CM

## 2023-10-28 DIAGNOSIS — F411 Generalized anxiety disorder: Secondary | ICD-10-CM | POA: Diagnosis not present

## 2023-10-28 MED ORDER — PANTOPRAZOLE SODIUM 20 MG PO TBEC
20.0000 mg | DELAYED_RELEASE_TABLET | Freq: Every day | ORAL | 1 refills | Status: DC
  Filled 2023-11-29: qty 90, 90d supply, fill #0
  Filled 2024-02-29: qty 80, 80d supply, fill #1

## 2023-10-28 MED ORDER — ESZOPICLONE 1 MG PO TABS: 1.0000 mg | ORAL_TABLET | Freq: Every evening | ORAL | 1 refills | Status: DC | PRN

## 2023-10-28 MED ORDER — DICLOFENAC SODIUM 1 % EX GEL
CUTANEOUS | 1 refills | Status: AC
  Filled 2023-11-29: qty 100, 30d supply, fill #0

## 2023-10-28 MED ORDER — MELOXICAM 7.5 MG PO TABS
7.5000 mg | ORAL_TABLET | Freq: Every day | ORAL | 6 refills | Status: AC
  Filled 2023-11-29: qty 60, 30d supply, fill #0
  Filled 2024-01-04: qty 60, 30d supply, fill #1
  Filled 2024-01-30: qty 60, 30d supply, fill #2
  Filled 2024-02-29: qty 60, 30d supply, fill #3
  Filled 2024-04-10: qty 60, 30d supply, fill #4
  Filled 2024-05-13: qty 60, 30d supply, fill #5

## 2023-10-28 MED ORDER — ESCITALOPRAM OXALATE 10 MG PO TABS
ORAL_TABLET | ORAL | 0 refills | Status: DC
  Filled 2023-12-02: qty 24, 12d supply, fill #0

## 2023-10-28 NOTE — Assessment & Plan Note (Signed)
 Patient presents today with elevated blood pressure readings. She endorses history of white coat syndrome and blood pressures being influenced by emotions. She denies headaches, visual changes, or chest pain. Blood pressure log given today. Will discuss treatment options at her 6 week follow up. Advised DASH diet and increased physical activity.

## 2023-10-28 NOTE — Assessment & Plan Note (Signed)
 Patient presents today with history of insomnia. We discussed importance of sleep hygiene. I advised regular exercise and decreased alcohol intake. Will trial Lunesta at bedtime. Follow up in 6 weeks.

## 2023-10-28 NOTE — Assessment & Plan Note (Signed)
 Lexapro  refilled for anxiety. Start 10mg  daily for 2 weeks and then may increase to 20mg  daily. Patient counseled on side effects such as nausea and headache, she was advised to stop taking medication if she experiences suicidal ideation. Follow up in 6 weeks.

## 2023-10-28 NOTE — Assessment & Plan Note (Signed)
 Stable on current treatment plan. Medication refilled.

## 2023-10-28 NOTE — Assessment & Plan Note (Signed)
 Reports history of prediabetes, last recalled A1c of 5.8. A1c today. Will discuss treatment options at follow up pending lab results.

## 2023-10-28 NOTE — Assessment & Plan Note (Signed)
 Patient presents today with history of depression, worse in the last few months. Lexapro  refilled today. Patient advised 10mg  daily for 2 weeks, and may increase back to 20mg  daily there after. Patient counseled on side effects such as nausea and headache, she was advised to stop taking medication if she experiences suicidal ideation. Patient to follow up in 6 weeks.

## 2023-10-28 NOTE — Progress Notes (Signed)
 New Patient Office Visit  Subjective    Patient ID: Ariel Martin, female    DOB: January 14, 1981  Age: 43 y.o. MRN: [id number]  CC: No chief complaint on file.   HPI Ariel Martin presents to establish care  Patient presents today with past medical history significant for depression, anxiety, obstructive sleep apnea, prediabetes, insomnia, GERD, plantar fasciitis, and hip osteoarthritis. She reports she has been out of daily medication for about 2 months as she has been waiting to establish care with our office. Today she reports significant depression and anxiety, due current life situations. She denies mania or suicidal ideation. She does admit to drinking more alcohol (wine) than usual, approximately 3-4 bottles a week in the last month or so. Additionally, she reports history of insomnia, not improved with trazodone . She endorses night time routine with a regular bed time, however still has 2-3 sleepless nights per week. She I s interested in medication to improve symptoms.     Outpatient Encounter Medications as of 10/28/2023  Medication Sig   escitalopram  (LEXAPRO ) 10 MG tablet Take 1 tablet (10 mg total) by mouth daily for 14 days, THEN 2 tablets (20 mg total) daily.   eszopiclone (LUNESTA) 1 MG TABS tablet Take 1 tablet (1 mg total) by mouth at bedtime as needed for sleep. Take immediately before bedtime   [DISCONTINUED] diclofenac  Sodium (VOLTAREN  ARTHRITIS PAIN) 1 % GEL Apply topically as directed   [DISCONTINUED] escitalopram  (LEXAPRO ) 10 MG tablet Take 2 tablets (20 mg total) by mouth daily.   [DISCONTINUED] meloxicam  (MOBIC ) 7.5 MG tablet Take 1-2 tablets (7.5-15 mg total) by mouth daily.   [DISCONTINUED] pantoprazole  (PROTONIX ) 20 MG tablet Take 1 tablet (20 mg total) by mouth daily.   [DISCONTINUED] traZODone  (DESYREL ) 100 MG tablet Take 1 tablet (100 mg total) by mouth at bedtime as needed. *pt needs appt for refills*   diclofenac  Sodium (VOLTAREN  ARTHRITIS PAIN) 1 % GEL Apply  topically as directed   meloxicam  (MOBIC ) 7.5 MG tablet Take 1-2 tablets (7.5-15 mg total) by mouth daily.   pantoprazole  (PROTONIX ) 20 MG tablet Take 1 tablet (20 mg total) by mouth daily.   [DISCONTINUED] doxycycline  (VIBRAMYCIN ) 100 MG capsule Take 1 capsule (100 mg total) by mouth 2 (two) times daily. (Patient not taking: Reported on 10/28/2023)   [DISCONTINUED] escitalopram  (LEXAPRO ) 10 MG tablet Take 1 tablet (10 mg total) by mouth daily. (Patient not taking: Reported on 10/28/2023)   [DISCONTINUED] escitalopram  (LEXAPRO ) 10 MG tablet Take 1 tablet (10 mg total) by mouth daily. (Patient not taking: Reported on 10/28/2023)   [DISCONTINUED] escitalopram  (LEXAPRO ) 10 MG tablet Take 2 tablets (20 mg total) by mouth daily.   [DISCONTINUED] naproxen  (NAPROSYN ) 500 MG tablet Take 1 tablet (500 mg total) by mouth 2 (two) times daily with a meal.   [DISCONTINUED] pantoprazole  (PROTONIX ) 20 MG tablet Take 1 tablet (20 mg total) by mouth daily. (Patient not taking: Reported on 10/28/2023)   [DISCONTINUED] pantoprazole  (PROTONIX ) 20 MG tablet Take 1 tablet (20 mg total) by mouth daily. (Patient not taking: Reported on 10/28/2023)   [DISCONTINUED] PARoxetine (PAXIL) 20 MG tablet Take 20 mg by mouth daily.   [DISCONTINUED] sulfamethoxazole -trimethoprim  (BACTRIM  DS) 800-160 MG tablet Take 1 tablet by mouth 2 (two) times daily. (Patient not taking: Reported on 10/28/2023)   No facility-administered encounter medications on file as of 10/28/2023.    History reviewed. No pertinent past medical history.  History reviewed. No pertinent surgical history.  Family History  Problem Relation Age of Onset  Breast cancer Neg Hx     Social History   Socioeconomic History   Marital status: Single    Spouse name: Not on file   Number of children: Not on file   Years of education: Not on file   Highest education level: Not on file  Occupational History   Not on file  Tobacco Use   Smoking status: Never   Smokeless  tobacco: Never  Substance and Sexual Activity   Alcohol use: Yes   Drug use: Not on file   Sexual activity: Not on file  Other Topics Concern   Not on file  Social History Narrative   Not on file   Social Drivers of Health   Financial Resource Strain: Not on file  Food Insecurity: Not on file  Transportation Needs: Not on file  Physical Activity: Not on file  Stress: Not on file  Social Connections: Not on file  Intimate Partner Violence: Not on file    Review of Systems  Constitutional:  Negative for fever, malaise/fatigue and weight loss.  Respiratory:  Negative for cough and shortness of breath.   Cardiovascular:  Negative for chest pain.  Neurological:  Negative for dizziness.  Endo/Heme/Allergies:  Negative for polydipsia.  Psychiatric/Behavioral:  Positive for depression. Negative for suicidal ideas. The patient is nervous/anxious and has insomnia.         Objective    BP (!) 142/88   Pulse (!) 107   Temp 98.1 F (36.7 C)   Ht 5' 5.25 (1.657 m)   Wt 276 lb 6.4 oz (125.4 kg)   SpO2 99%   BMI 45.64 kg/m   Physical Exam Constitutional:      Appearance: Normal appearance. She is obese.  HENT:     Head: Normocephalic.     Mouth/Throat:     Mouth: Mucous membranes are moist.     Pharynx: Oropharynx is clear.   Eyes:     Extraocular Movements: Extraocular movements intact.     Conjunctiva/sclera: Conjunctivae normal.    Cardiovascular:     Rate and Rhythm: Normal rate and regular rhythm.     Heart sounds: Normal heart sounds. No murmur heard. Pulmonary:     Effort: Pulmonary effort is normal.     Breath sounds: No wheezing, rhonchi or rales.   Musculoskeletal:     Right lower leg: No edema.     Left lower leg: No edema.   Skin:    General: Skin is warm and dry.   Neurological:     General: No focal deficit present.     Mental Status: She is alert and oriented to person, place, and time.   Psychiatric:        Mood and Affect: Mood normal.         Behavior: Behavior normal.       Assessment & Plan:  Encounter to establish care  Depression, recurrent [facility]) Assessment & Plan: Patient presents today with history of depression, worse in the last few months. Lexapro  refilled today. Patient advised 10mg  daily for 2 weeks, and may increase back to 20mg  daily there after. Patient counseled on side effects such as nausea and headache, she was advised to stop taking medication if she experiences suicidal ideation. Patient to follow up in 6 weeks.   Orders: -     Escitalopram  Oxalate; Take 1 tablet (10 mg total) by mouth daily for 14 days, THEN 2 tablets (20 mg total) daily.  Dispense: 74 tablet; Refill: 0  Insomnia,  unspecified type Assessment & Plan: Patient presents today with history of insomnia. We discussed importance of sleep hygiene. I advised regular exercise and decreased alcohol intake. Will trial Lunesta at bedtime. Follow up in 6 weeks.    Orders: -     Eszopiclone; Take 1 tablet (1 mg total) by mouth at bedtime as needed for sleep. Take immediately before bedtime  Dispense: 30 tablet; Refill: 1  Generalized anxiety disorder Assessment & Plan: Lexapro  refilled for anxiety. Start 10mg  daily for 2 weeks and then may increase to 20mg  daily. Patient counseled on side effects such as nausea and headache, she was advised to stop taking medication if she experiences suicidal ideation. Follow up in 6 weeks.   Orders: -     Escitalopram  Oxalate; Take 1 tablet (10 mg total) by mouth daily for 14 days, THEN 2 tablets (20 mg total) daily.  Dispense: 74 tablet; Refill: 0  Elevated blood pressure reading without diagnosis of hypertension Assessment & Plan: Patient presents today with elevated blood pressure readings. She endorses history of white coat syndrome and blood pressures being influenced by emotions. She denies headaches, visual changes, or chest pain. Blood pressure log given today. Will discuss treatment options at her 6  week follow up. Advised DASH diet and increased physical activity.    Prediabetes Assessment & Plan: Reports history of prediabetes, last recalled A1c of 5.8. A1c today. Will discuss treatment options at follow up pending lab results.   Orders: -     Lipid panel -     CMP14+EGFR -     CBC with Differential/Platelet -     Hemoglobin A1c  Gastroesophageal reflux disease without esophagitis Assessment & Plan: Stable on current treatment plan. Medication refilled.   Orders: -     Pantoprazole  Sodium; Take 1 tablet (20 mg total) by mouth daily.  Dispense: 100 tablet; Refill: 1  Osteoarthritis of both hips, unspecified osteoarthritis type -     Meloxicam ; Take 1-2 tablets (7.5-15 mg total) by mouth daily.  Dispense: 60 tablet; Refill: 6  Plantar fasciitis -     Diclofenac  Sodium; Apply topically as directed  Dispense: 150 g; Refill: 1  Screening for lipid disorders -     Lipid panel    Return in about 6 weeks (around 12/09/2023) for depression and sleep.   Charmaine Brahim Dolman, PA-C

## 2023-10-29 ENCOUNTER — Ambulatory Visit: Payer: Self-pay | Admitting: Physician Assistant

## 2023-10-29 LAB — CMP14+EGFR
ALT: 140 IU/L — ABNORMAL HIGH (ref 0–32)
AST: 77 IU/L — ABNORMAL HIGH (ref 0–40)
Albumin: 4.5 g/dL (ref 3.9–4.9)
Alkaline Phosphatase: 91 IU/L (ref 44–121)
BUN/Creatinine Ratio: 14 (ref 9–23)
BUN: 11 mg/dL (ref 6–24)
Bilirubin Total: 0.6 mg/dL (ref 0.0–1.2)
CO2: 24 mmol/L (ref 20–29)
Calcium: 9.6 mg/dL (ref 8.7–10.2)
Chloride: 99 mmol/L (ref 96–106)
Creatinine, Ser: 0.78 mg/dL (ref 0.57–1.00)
Globulin, Total: 3 g/dL (ref 1.5–4.5)
Glucose: 128 mg/dL — ABNORMAL HIGH (ref 70–99)
Potassium: 4.4 mmol/L (ref 3.5–5.2)
Sodium: 138 mmol/L (ref 134–144)
Total Protein: 7.5 g/dL (ref 6.0–8.5)
eGFR: 97 mL/min/{1.73_m2} (ref >59)

## 2023-10-29 LAB — CBC WITH DIFFERENTIAL/PLATELET
Basophils Absolute: 0.1 10*3/uL (ref 0.0–0.2)
Basos: 0 %
EOS (ABSOLUTE): 0.2 10*3/uL (ref 0.0–0.4)
Eos: 2 %
Hematocrit: 46.3 % (ref 34.0–46.6)
Hemoglobin: 15.3 g/dL (ref 11.1–15.9)
Immature Grans (Abs): 0 10*3/uL (ref 0.0–0.1)
Immature Granulocytes: 0 %
Lymphocytes Absolute: 3.6 10*3/uL — ABNORMAL HIGH (ref 0.7–3.1)
Lymphs: 26 %
MCH: 29.1 pg (ref 26.6–33.0)
MCHC: 33 g/dL (ref 31.5–35.7)
MCV: 88 fL (ref 79–97)
Monocytes Absolute: 0.7 10*3/uL (ref 0.1–0.9)
Monocytes: 5 %
Neutrophils Absolute: 8.9 10*3/uL — ABNORMAL HIGH (ref 1.4–7.0)
Neutrophils: 67 %
Platelets: 337 10*3/uL (ref 150–450)
RBC: 5.25 x10E6/uL (ref 3.77–5.28)
RDW: 12.9 % (ref 11.7–15.4)
WBC: 13.5 10*3/uL — ABNORMAL HIGH (ref 3.4–10.8)

## 2023-10-29 LAB — LIPID PANEL
Chol/HDL Ratio: 3.7 ratio (ref 0.0–4.4)
Cholesterol, Total: 223 mg/dL — ABNORMAL HIGH (ref 100–199)
HDL: 60 mg/dL (ref >39)
LDL Chol Calc (NIH): 139 mg/dL — ABNORMAL HIGH (ref 0–99)
Triglycerides: 136 mg/dL (ref 0–149)
VLDL Cholesterol Cal: 24 mg/dL (ref 5–40)

## 2023-10-29 LAB — HEMOGLOBIN A1C
Est. average glucose Bld gHb Est-mCnc: 128 mg/dL
Hgb A1c MFr Bld: 6.1 % — ABNORMAL HIGH (ref 4.8–5.6)

## 2023-11-08 ENCOUNTER — Encounter: Payer: Self-pay | Admitting: Physician Assistant

## 2023-11-12 ENCOUNTER — Other Ambulatory Visit: Payer: Self-pay | Admitting: Physician Assistant

## 2023-11-12 DIAGNOSIS — G4700 Insomnia, unspecified: Secondary | ICD-10-CM

## 2023-11-12 MED ORDER — ESZOPICLONE 2 MG PO TABS: 2.0000 mg | ORAL_TABLET | Freq: Every evening | ORAL | 1 refills | Status: DC | PRN

## 2023-11-29 ENCOUNTER — Other Ambulatory Visit (HOSPITAL_COMMUNITY): Payer: Self-pay

## 2023-12-01 ENCOUNTER — Other Ambulatory Visit (HOSPITAL_COMMUNITY): Payer: Self-pay

## 2023-12-01 ENCOUNTER — Other Ambulatory Visit: Payer: Self-pay

## 2023-12-03 ENCOUNTER — Other Ambulatory Visit (HOSPITAL_COMMUNITY): Payer: Self-pay

## 2023-12-09 ENCOUNTER — Encounter: Payer: Self-pay | Admitting: Physician Assistant

## 2023-12-09 ENCOUNTER — Ambulatory Visit: Admitting: Physician Assistant

## 2023-12-09 ENCOUNTER — Other Ambulatory Visit (HOSPITAL_COMMUNITY): Payer: Self-pay

## 2023-12-09 ENCOUNTER — Other Ambulatory Visit: Payer: Self-pay

## 2023-12-09 VITALS — BP 160/89 | HR 76 | Temp 97.2°F | Ht 65.25 in | Wt 276.2 lb

## 2023-12-09 DIAGNOSIS — I10 Essential (primary) hypertension: Secondary | ICD-10-CM

## 2023-12-09 DIAGNOSIS — G4700 Insomnia, unspecified: Secondary | ICD-10-CM

## 2023-12-09 MED ORDER — TRAZODONE HCL 50 MG PO TABS
50.0000 mg | ORAL_TABLET | Freq: Every day | ORAL | 1 refills | Status: DC
  Filled 2023-12-09: qty 90, 90d supply, fill #0

## 2023-12-09 MED ORDER — ESCITALOPRAM OXALATE 20 MG PO TABS
20.0000 mg | ORAL_TABLET | Freq: Every day | ORAL | 1 refills | Status: AC
  Filled 2023-12-09: qty 90, 90d supply, fill #0
  Filled 2024-03-13: qty 90, 90d supply, fill #1

## 2023-12-09 MED ORDER — VALSARTAN 80 MG PO TABS
80.0000 mg | ORAL_TABLET | Freq: Every day | ORAL | 1 refills | Status: DC
  Filled 2023-12-09: qty 30, 30d supply, fill #0
  Filled 2024-01-09: qty 30, 30d supply, fill #1

## 2023-12-09 NOTE — Progress Notes (Signed)
 Established Patient Office Visit  Subjective   Patient ID: Ariel Martin, female    DOB: 14-Jan-1981  Age: 43 y.o. MRN: [id number]  No chief complaint on file.   Patient presents today for follow up regarding depression, anxiety, sleep, and blood pressure. She reports she feels much improved with Lexapro , states anxiety and depression symptoms are back to her baseline. Denies SI or panic attacks. She reports, Lunesta  was effective for sleep for a few nights, but states it felt like my body was fighting through the medicine, and although she was tired she was unable to fall asleep. States she does not feel anxious at night however being unable to fall asleep makes her frustrated and anxious. Previously, was on trazodone  but stopped medication in the past due to feelings of tolerance. She reports improvement in sleep hygiene and night time routine, however continue to have poor sleep, getting approximately 5 hours a night. Additionally, she reports decreased alcohol intake. Lastly, patient reports blood pressure was on average 130-140/70s at home. Denies headache, chest pain, shortness of breath, or visual changes, however wonders if this is what is causing troubles with her sleep.      Review of Systems  Constitutional:  Negative for chills, fever and malaise/fatigue.  Eyes:  Negative for blurred vision and double vision.  Respiratory:  Negative for cough and shortness of breath.   Cardiovascular:  Negative for chest pain and palpitations.  Musculoskeletal:  Negative for joint pain and myalgias.  Neurological:  Negative for dizziness and headaches.  Psychiatric/Behavioral:  The patient has insomnia.       Objective:     BP (!) 160/89   Pulse 76   Temp (!) 97.2 F (36.2 C)   Ht 5' 5.25 (1.657 m)   Wt 276 lb 3.2 oz (125.3 kg)   SpO2 98%   BMI 45.61 kg/m    Physical Exam Constitutional:      Appearance: Normal appearance.  HENT:     Head: Normocephalic and atraumatic.      Mouth/Throat:     Mouth: Mucous membranes are moist.     Pharynx: Oropharynx is clear.  Eyes:     Extraocular Movements: Extraocular movements intact.     Conjunctiva/sclera: Conjunctivae normal.  Cardiovascular:     Rate and Rhythm: Normal rate and regular rhythm.     Heart sounds: Normal heart sounds. No murmur heard. Pulmonary:     Effort: Pulmonary effort is normal.     Breath sounds: Normal breath sounds. No wheezing, rhonchi or rales.  Skin:    General: Skin is warm and dry.  Neurological:     General: No focal deficit present.     Mental Status: She is alert and oriented to person, place, and time.  Psychiatric:        Mood and Affect: Mood normal.        Behavior: Behavior normal.      No results found for any visits on 12/09/23.  The 10-year ASCVD risk score (Arnett DK, et al., 2019) is: 1.6%    Assessment & Plan:   Return in about 4 weeks (around 01/06/2024) for BP .   Insomnia, unspecified type Assessment & Plan: Patient presents today with continued concerns with insomnia. Discussed importance of routine and sleep hygiene. Will trial trazodone  as needed for sleep. Patient encouraged to sleep when she feels tired, and get out of bed if she has not fallen asleep within an hour. Discussed relaxation techniques.   Orders: -  traZODone  HCl; Take 1 tablet (50 mg total) by mouth at bedtime.  Dispense: 90 tablet; Refill: 1  Primary hypertension Assessment & Plan: 167/108, 160/89 Uncontrolled. Considering elevated home readings as well as in office readings, will start valsartan  80 mg daily.  Discussed DASH diet and dietary sodium restrictions.  Increase dietary efforts and physical activity. Patient to follow up in 4 weeks.    Orders: -     Valsartan ; Take 1 tablet (80 mg total) by mouth daily.  Dispense: 30 tablet; Refill: 1  Other orders -     Escitalopram  Oxalate; Take 1 tablet (20 mg total) by mouth daily.  Dispense: 90 tablet; Refill: 1    Fain Francis, PA-C

## 2023-12-09 NOTE — Assessment & Plan Note (Signed)
 Patient presents today with continued concerns with insomnia. Discussed importance of routine and sleep hygiene. Will trial trazodone  as needed for sleep. Patient encouraged to sleep when she feels tired, and get out of bed if she has not fallen asleep within an hour. Discussed relaxation techniques.

## 2023-12-09 NOTE — Assessment & Plan Note (Addendum)
 167/108, 160/89 Uncontrolled. Considering elevated home readings as well as in office readings, will start valsartan  80 mg daily.  Discussed DASH diet and dietary sodium restrictions.  Increase dietary efforts and physical activity. Patient to follow up in 4 weeks.

## 2024-01-05 ENCOUNTER — Other Ambulatory Visit: Payer: Self-pay

## 2024-01-06 ENCOUNTER — Ambulatory Visit: Admitting: Physician Assistant

## 2024-01-09 ENCOUNTER — Other Ambulatory Visit (HOSPITAL_COMMUNITY): Payer: Self-pay

## 2024-01-21 ENCOUNTER — Ambulatory Visit: Admitting: Physician Assistant

## 2024-01-21 ENCOUNTER — Encounter: Payer: Self-pay | Admitting: Physician Assistant

## 2024-01-21 VITALS — BP 128/82 | HR 84 | Ht 65.25 in | Wt 279.4 lb

## 2024-01-21 DIAGNOSIS — G4700 Insomnia, unspecified: Secondary | ICD-10-CM | POA: Diagnosis not present

## 2024-01-21 DIAGNOSIS — I10 Essential (primary) hypertension: Secondary | ICD-10-CM | POA: Diagnosis not present

## 2024-01-21 MED ORDER — TRAZODONE HCL 50 MG PO TABS
100.0000 mg | ORAL_TABLET | Freq: Every day | ORAL | 2 refills | Status: AC
  Filled 2024-04-09: qty 90, 45d supply, fill #0

## 2024-01-21 NOTE — Progress Notes (Signed)
 Established Patient Office Visit  Subjective   Patient ID: Ariel Martin, female    DOB: [birth year]  Age: 43 y.o. MRN: [id number]  Chief Complaint  Patient presents with   Follow-up    4 week BP follow up     Discussed the use of AI scribe software for clinical note transcription with the patient, who gave verbal consent to proceed.  History of Present Illness Ariel Martin is a 43 year old female who presents for medication management and follow-up.  She is taking valsartan  for hypertension without experiencing side effects such as headaches, visual changes, chest pain, or leg cramping. Lexapro  remains unchanged and effectively manages her menopausal symptoms, including hot flashes. Trazodone  was reintroduced; she takes one tablet on busy days and two on less active days. Black cohosh has been added to help manage menopausal symptoms by balancing her internal temperature. Her work environment is stressful due to being understaffed, increasing her workload and stress levels. She does not have concerns or complaints today.      Review of Systems  Constitutional:  Negative for chills, fever and malaise/fatigue.  Eyes:  Negative for blurred vision and double vision.  Respiratory:  Negative for cough and shortness of breath.   Cardiovascular:  Negative for chest pain and palpitations.  Neurological:  Negative for dizziness and headaches.      Objective:     BP 128/82   Pulse 84   Ht 5' 5.25 (1.657 m)   Wt 279 lb 6.4 oz (126.7 kg)   SpO2 97%   BMI 46.14 kg/m    Physical Exam Constitutional:      General: She is not in acute distress.    Appearance: Normal appearance. She is obese. She is not ill-appearing.  HENT:     Head: Normocephalic and atraumatic.     Mouth/Throat:     Mouth: Mucous membranes are moist.     Pharynx: Oropharynx is clear.  Eyes:     Extraocular Movements: Extraocular movements intact.     Conjunctiva/sclera: Conjunctivae normal.   Cardiovascular:     Rate and Rhythm: Normal rate and regular rhythm.     Heart sounds: Normal heart sounds. No murmur heard. Pulmonary:     Effort: Pulmonary effort is normal.     Breath sounds: Normal breath sounds. No wheezing.  Musculoskeletal:     Right lower leg: No edema.     Left lower leg: No edema.  Skin:    General: Skin is warm and dry.  Neurological:     General: No focal deficit present.     Mental Status: She is alert and oriented to person, place, and time.  Psychiatric:        Mood and Affect: Mood normal.        Behavior: Behavior normal.     No results found for any visits on 01/21/24.  The 10-year ASCVD risk score (Arnett DK, et al., 2019) is: 1%    Assessment & Plan:   Return in about 6 months (around 07/20/2024) for BP/depression.   Primary hypertension Assessment & Plan: 128/82 Controlled. BMP today.  Continue current medications. No change in management. Discussed DASH diet and dietary sodium restrictions.  Increase dietary efforts and physical activity.   Orders: -     Basic metabolic panel with GFR  Insomnia, unspecified type -     traZODone  HCl; Take 2 tablets (100 mg total) by mouth at bedtime.  Dispense: 90 tablet; Refill: 2    Charmaine  Patty Leitzke, PA-C

## 2024-01-21 NOTE — Assessment & Plan Note (Signed)
 128/82 Controlled. BMP today.  Continue current medications. No change in management. Discussed DASH diet and dietary sodium restrictions.  Increase dietary efforts and physical activity.

## 2024-01-22 DIAGNOSIS — I10 Essential (primary) hypertension: Secondary | ICD-10-CM | POA: Diagnosis not present

## 2024-01-23 LAB — BASIC METABOLIC PANEL WITH GFR
BUN/Creatinine Ratio: 25 — ABNORMAL HIGH (ref 9–23)
BUN: 16 mg/dL (ref 6–24)
CO2: 23 mmol/L (ref 20–29)
Calcium: 9.1 mg/dL (ref 8.7–10.2)
Chloride: 100 mmol/L (ref 96–106)
Creatinine, Ser: 0.63 mg/dL (ref 0.57–1.00)
Glucose: 141 mg/dL — ABNORMAL HIGH (ref 70–99)
Potassium: 4.6 mmol/L (ref 3.5–5.2)
Sodium: 138 mmol/L (ref 134–144)
eGFR: 113 mL/min/1.73 (ref >59)

## 2024-01-26 ENCOUNTER — Ambulatory Visit: Payer: Self-pay | Admitting: Physician Assistant

## 2024-02-01 ENCOUNTER — Other Ambulatory Visit (HOSPITAL_COMMUNITY): Payer: Self-pay

## 2024-02-16 ENCOUNTER — Other Ambulatory Visit: Payer: Self-pay

## 2024-02-16 ENCOUNTER — Other Ambulatory Visit (HOSPITAL_COMMUNITY): Payer: Self-pay

## 2024-02-16 ENCOUNTER — Other Ambulatory Visit: Payer: Self-pay | Admitting: Physician Assistant

## 2024-02-16 DIAGNOSIS — I10 Essential (primary) hypertension: Secondary | ICD-10-CM

## 2024-02-16 MED ORDER — VALSARTAN 80 MG PO TABS
80.0000 mg | ORAL_TABLET | Freq: Every day | ORAL | 1 refills | Status: DC
  Filled 2024-02-16: qty 30, 30d supply, fill #0
  Filled 2024-03-13: qty 30, 30d supply, fill #1

## 2024-03-01 ENCOUNTER — Other Ambulatory Visit: Payer: Self-pay

## 2024-03-01 ENCOUNTER — Other Ambulatory Visit (HOSPITAL_COMMUNITY): Payer: Self-pay

## 2024-03-04 ENCOUNTER — Telehealth

## 2024-03-14 ENCOUNTER — Other Ambulatory Visit (HOSPITAL_COMMUNITY): Payer: Self-pay

## 2024-04-08 ENCOUNTER — Other Ambulatory Visit (HOSPITAL_COMMUNITY): Payer: Self-pay

## 2024-04-09 ENCOUNTER — Other Ambulatory Visit: Payer: Self-pay

## 2024-04-09 ENCOUNTER — Other Ambulatory Visit (HOSPITAL_COMMUNITY): Payer: Self-pay

## 2024-04-10 ENCOUNTER — Other Ambulatory Visit: Payer: Self-pay | Admitting: Physician Assistant

## 2024-04-10 ENCOUNTER — Other Ambulatory Visit (HOSPITAL_COMMUNITY): Payer: Self-pay

## 2024-04-10 DIAGNOSIS — I10 Essential (primary) hypertension: Secondary | ICD-10-CM

## 2024-04-12 ENCOUNTER — Other Ambulatory Visit: Payer: Self-pay

## 2024-04-12 ENCOUNTER — Other Ambulatory Visit (HOSPITAL_COMMUNITY): Payer: Self-pay

## 2024-04-12 MED ORDER — VALSARTAN 80 MG PO TABS
80.0000 mg | ORAL_TABLET | Freq: Every day | ORAL | 1 refills | Status: AC
  Filled 2024-04-12: qty 30, 30d supply, fill #0
  Filled 2024-04-30: qty 60, 60d supply, fill #0

## 2024-04-13 ENCOUNTER — Encounter: Payer: Self-pay | Admitting: Pharmacist

## 2024-04-13 ENCOUNTER — Other Ambulatory Visit: Payer: Self-pay

## 2024-04-15 ENCOUNTER — Other Ambulatory Visit: Payer: Self-pay

## 2024-04-30 ENCOUNTER — Other Ambulatory Visit (HOSPITAL_COMMUNITY): Payer: Self-pay

## 2024-05-01 ENCOUNTER — Other Ambulatory Visit (HOSPITAL_COMMUNITY): Payer: Self-pay

## 2024-05-13 ENCOUNTER — Other Ambulatory Visit: Payer: Self-pay

## 2024-05-13 ENCOUNTER — Other Ambulatory Visit: Payer: Self-pay | Admitting: Physician Assistant

## 2024-05-13 DIAGNOSIS — K219 Gastro-esophageal reflux disease without esophagitis: Secondary | ICD-10-CM

## 2024-05-13 MED ORDER — PANTOPRAZOLE SODIUM 20 MG PO TBEC
20.0000 mg | DELAYED_RELEASE_TABLET | Freq: Every day | ORAL | 1 refills | Status: AC
  Filled 2024-05-13: qty 90, 90d supply, fill #0

## 2024-07-20 ENCOUNTER — Ambulatory Visit: Admitting: Physician Assistant
